# Patient Record
Sex: Female | Born: 2000 | Hispanic: Yes | Marital: Single | Smoking: Never smoker
Health system: Southern US, Community
[De-identification: ages and names within clinical notes are randomized; demographics above are authoritative.]

## PROBLEM LIST (undated history)

## (undated) DIAGNOSIS — R569 Unspecified convulsions: Secondary | ICD-10-CM

## (undated) DIAGNOSIS — R51 Headache: Secondary | ICD-10-CM

## (undated) DIAGNOSIS — F419 Anxiety disorder, unspecified: Secondary | ICD-10-CM

## (undated) DIAGNOSIS — G259 Extrapyramidal and movement disorder, unspecified: Secondary | ICD-10-CM

## (undated) HISTORY — PX: NASAL SEPTUM SURGERY: SHX37

## (undated) HISTORY — DX: Extrapyramidal and movement disorder, unspecified: G25.9

## (undated) HISTORY — DX: Headache: R51

---
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
## 2021-01-07 ENCOUNTER — Encounter: Payer: Self-pay | Admitting: Family

## 2021-01-07 ENCOUNTER — Other Ambulatory Visit: Payer: Self-pay

## 2021-01-07 ENCOUNTER — Ambulatory Visit (INDEPENDENT_AMBULATORY_CARE_PROVIDER_SITE_OTHER): Payer: Medicaid Other | Admitting: Family

## 2021-01-07 VITALS — BP 106/64 | HR 72 | Resp 16 | Ht 62.5 in | Wt 141.0 lb

## 2021-01-07 DIAGNOSIS — F3341 Major depressive disorder, recurrent, in partial remission: Secondary | ICD-10-CM | POA: Diagnosis not present

## 2021-01-07 DIAGNOSIS — R011 Cardiac murmur, unspecified: Secondary | ICD-10-CM

## 2021-01-07 DIAGNOSIS — Z7189 Other specified counseling: Secondary | ICD-10-CM

## 2021-01-07 DIAGNOSIS — F445 Conversion disorder with seizures or convulsions: Secondary | ICD-10-CM | POA: Diagnosis not present

## 2021-01-07 DIAGNOSIS — F419 Anxiety disorder, unspecified: Secondary | ICD-10-CM | POA: Insufficient documentation

## 2021-01-07 DIAGNOSIS — F411 Generalized anxiety disorder: Secondary | ICD-10-CM | POA: Diagnosis not present

## 2021-01-07 DIAGNOSIS — F3281 Premenstrual dysphoric disorder: Secondary | ICD-10-CM

## 2021-01-07 DIAGNOSIS — Z79899 Other long term (current) drug therapy: Secondary | ICD-10-CM

## 2021-01-07 DIAGNOSIS — Z719 Counseling, unspecified: Secondary | ICD-10-CM

## 2021-01-07 DIAGNOSIS — F900 Attention-deficit hyperactivity disorder, predominantly inattentive type: Secondary | ICD-10-CM

## 2021-01-07 DIAGNOSIS — N926 Irregular menstruation, unspecified: Secondary | ICD-10-CM

## 2021-01-07 DIAGNOSIS — F339 Major depressive disorder, recurrent, unspecified: Secondary | ICD-10-CM | POA: Insufficient documentation

## 2021-01-07 MED ORDER — AMPHETAMINE SULFATE 10 MG PO TABS: 10.0000 mg | ORAL_TABLET | Freq: Every day | ORAL | 0 refills | Status: DC

## 2021-01-07 NOTE — Progress Notes (Signed)
[facility] [address] [place] [address] Dept: [phone] Dept Fax: [phone] Loc: [phone] Loc Fax: [phone]  Medication Check  Patient ID: Bridget Eaton, female  DOB: [birth year], 20 y.o.  MRN: [id number]  Date of Evaluation: 01/07/2021 PCP: Milus Height, PA  Accompanied by: self Patient Lives with: brothers  HISTORY/CURRENT STATUS: HPI  Patient here by herself for the visit today. Patient interactive and appropriate with provider. Patient graduated high school in December and working full-time now. Applied to 2 colleges for the fall in [place] with  2 majors for studying. Patient doing well with work and life balance now, but having issues with limited emotional responses. Still having issues with short-term memory and concerned with starting college in the fall. Had tried Concerta with adverse effects and discontinued medication. Wanting to discuss other options today.   EDUCATION/WORK: School: Graduated high school in December Accepted into [employer] for college in the Fall Scholarship for [employer].  Work: [employer] Monday through Friday about 34 hours/week  Activities/ Exercise: intermittently  MEDICAL HISTORY: Appetite: Less without mother to cook or grocery MVI/Other: B12 and Iron.  Sleep: Bedtime: 11-12:00 am  Awakens: 6:30-7:00 am  Concerns: Initiation/Maintenance/Other: No reported issues.   Individual Medical History/ Review of Systems: Changes? :Yes, seen Dr. [name] recently before retirement. Sent in new Rx for her Lexapro 20 mg. GYN place Nexaplanon.   Allergies: Fish-derived products and Shellfish allergy  Current Medications:  Current Outpatient Medications:  .  Amphetamine Sulfate (EVEKEO) 10 MG TABS, Take 10 mg by mouth daily., Disp: 30 tablet, Rfl: 0 .  escitalopram  (LEXAPRO) 20 MG tablet, Take 1 tablet (20 mg total) by mouth daily after breakfast., Disp: 90 tablet, Rfl: 3 .  etonogestrel (NEXPLANON) 68 MG IMPL implant, See admin instructions., Disp: , Rfl:  .  Ferrous Sulfate (IRON) 28 MG TABS, 1 tablet, Disp: , Rfl:  Medication Side Effects: None  Family Medical/ Social History: Changes? None reported recently. Went to visit mother and family in [place]  MENTAL HEALTH: Mental Health Issues: Depression and Anxiety-Lexapro 20 mg daily with no concerns and controlling symptoms.   PHYSICAL EXAM; Vitals:  Vitals:   01/07/21 1444  BP: 106/64  Pulse: 72  Resp: 16  Height: 5' 2.5" (1.588 m)  Weight: 141 lb (64 kg)  BMI (Calculated): 25.36   Physical Exam Vitals reviewed.  Constitutional:      Appearance: Normal appearance. She is well-developed, normal weight and well-nourished.  HENT:     Head: Normocephalic and atraumatic.     Right Ear: Tympanic membrane, ear canal and external ear normal.     Left Ear: Tympanic membrane, ear canal and external ear normal.     Nose: Nose normal.     Mouth/Throat:     Mouth: Oropharynx is clear and moist. Mucous membranes are moist.  Eyes:     Extraocular Movements: Extraocular movements intact and EOM normal.     Conjunctiva/sclera: Conjunctivae normal.     Pupils: Pupils are equal, round, and reactive to light.  Cardiovascular:     Rate and Rhythm: Normal rate and regular rhythm.     Pulses: Normal pulses and intact distal pulses.     Heart sounds: Murmur heard.    Pulmonary:     Effort: Pulmonary effort is normal.     Breath sounds: Normal breath sounds.  Abdominal:  General: Bowel sounds are normal.     Palpations: Abdomen is soft.  Musculoskeletal:        General: Normal range of motion.     Cervical back: Normal range of motion and neck supple.  Skin:    General: Skin is warm and dry.     Capillary Refill: Capillary refill takes less than 2 seconds.  Neurological:     General: No  focal deficit present.     Mental Status: She is alert and oriented to person, place, and time.     Deep Tendon Reflexes: Reflexes are normal and symmetric.  Psychiatric:        Mood and Affect: Mood and affect normal.        Behavior: Behavior normal.        Thought Content: Thought content normal.        Judgment: Judgment normal.   General Physical Exam: Unchanged from previous exam, date:None Changed:None reported  DIAGNOSES:    ICD-10-CM   1. ADHD (attention deficit hyperactivity disorder), inattentive type  F90.0   2. Recurrent major depression in partial remission (HCC)  F33.41   3. 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 GAD (generalized anxiety disorder)  F41.1   5. Premenstrual dysphoric disorder  F32.81   6. Heart murmur  R01.1   7. Menstrual disorder  N92.6   8. Patient counseled  Z71.9   9. Medication management  Z79.899   10. Goals of care, counseling/discussion  Z71.89    ASSESSMENT: Patient tolerating her Lexapro with no concerns and no side effects. Seen Dr. [name] 1 last time before retirement. No changes made to current dose of Lexapro to assist with management of her anxiety and depression. See other medical professional to address other ongoing medical issues with management of these diagnoses. Reports limited emotional responses to being happy or sad, but not wanting to change medication at this time. Having continued issues with short-term memory with trial and failure of Concerta. Looking at treatment options for her attention at the visit today.   RECOMMENDATIONS:  Reviewed old records and/or current chart with the patient since last f/u visit.  Support provided to patient with mother moving to [place] with her family and patient being separated from her mother.   No significant medical history with changes reported and reviewed  today's examination with patient.  Information regarding high school graduation and applications for  college in the fall.  Advised patient to seek disability services, as needed, for college to assist with accommodations and/or modification for academic success.   Reviewed work history with full-time hours and managing her stress level.  May consider counseling to assist with anxiety, depression and attention issues. Services to be discussed at next visit.   Reviewed structure and routine for consistency with management of daily activities.   Discussed visit with Dr. [name] before retirement and no changes  made to medications.   Counseled medication pharmacokinetics, options, dosage, administration, desired effects, and possible side effects.   Continue with Lexapro 20 mg daily, no Rx today Start Eveleo 10 mg 1/2-1 tablet daily, # 30 with no RF's.RX for above e-scribed and sent to pharmacy on record  [id number] [place], [place] - [address] [place] [address] Phone: [phone] Fax: [phone]  Advised importance of:  Good sleep hygiene (8- 10 hours per night, no TV or video games for 1 hour before bedtime) Limited screen time (none on  school nights, no more than 2 hours/day on weekends, use of screen time for motivation) Regular exercise(outside and active play) Healthy eating (drink water or milk, no sodas/sweet tea, limit portions and no seconds).   NEXT APPOINTMENT: Return in about 3 months (around 04/06/2021) for f/u visit .  [name], NP Counseling Time: 40 mins Total Contact Time: 45 mins

## 2021-01-08 ENCOUNTER — Encounter: Payer: Self-pay | Admitting: Family

## 2021-01-08 ENCOUNTER — Telehealth: Payer: Self-pay

## 2021-01-08 NOTE — Telephone Encounter (Signed)
Outcome Approvedtoday PA Case: [id number], Status: Approved, Coverage Starts on: 01/08/2021 12:00:00 AM, Coverage Ends on: 01/08/2022 12:00:00 AM.

## 2021-04-05 ENCOUNTER — Encounter: Payer: Self-pay | Admitting: Family

## 2021-04-05 ENCOUNTER — Telehealth (INDEPENDENT_AMBULATORY_CARE_PROVIDER_SITE_OTHER): Payer: Medicaid Other | Admitting: Family

## 2021-04-05 ENCOUNTER — Other Ambulatory Visit: Payer: Self-pay

## 2021-04-05 DIAGNOSIS — Z7189 Other specified counseling: Secondary | ICD-10-CM

## 2021-04-05 DIAGNOSIS — F411 Generalized anxiety disorder: Secondary | ICD-10-CM | POA: Diagnosis not present

## 2021-04-05 DIAGNOSIS — F900 Attention-deficit hyperactivity disorder, predominantly inattentive type: Secondary | ICD-10-CM

## 2021-04-05 DIAGNOSIS — F3341 Major depressive disorder, recurrent, in partial remission: Secondary | ICD-10-CM | POA: Diagnosis not present

## 2021-04-05 DIAGNOSIS — Z719 Counseling, unspecified: Secondary | ICD-10-CM

## 2021-04-05 DIAGNOSIS — Z79899 Other long term (current) drug therapy: Secondary | ICD-10-CM

## 2021-04-05 MED ORDER — AMPHETAMINE SULFATE 10 MG PO TABS: 10.0000 mg | ORAL_TABLET | Freq: Every day | ORAL | 0 refills | Status: DC

## 2021-04-05 NOTE — Progress Notes (Signed)
[facility] [address] [place] [address] Dept: [phone] Dept Fax: [phone]  Medication Check visit via Virtual Video   Patient ID:  Bridget Eaton  female DOB: May 25, 2001   19 y.o.   MRN: [id number]   DATE:04/05/21  PCP: Milus Height, PA  Virtual Visit via Video Note  I connected with  Bridget Eaton on 04/05/21 at  2:00 PM EDT by a video enabled telemedicine application and verified that I am speaking with the correct person using two identifiers. Patient/Parent Location: at work   I discussed the limitations, risks, security and privacy concerns of performing an evaluation and management service by telephone and the availability of in person appointments. I also discussed with the parents that there may be a patient responsible charge related to this service. The parents expressed understanding and agreed to proceed.  Provider: [name], NP  Location: private work location  HPI/CURRENT STATUS: Bridget Eaton is here for medication management of the psychoactive medications for ADHD and review of educational and behavioral concerns.   Bridget Eaton currently taking Evekeo 10 mg 1/4 tablet in the morning and Lexapro 20 mg daily, which is working well. Takes medication as directed in the morning, Medication tends to last for several hours. Bridget Eaton is able to focus through homework.   Bridget Eaton is eating well (eating breakfast, lunch and dinner). No recent changes.   Sleeping well (goes to bed at 11-12:00 am the latest wakes at 7:00 am), sleeping through the night. Some afternoon's will take long naps, about 2 hours..  EDUCATION/WORK: School: [employer]- August  Year/Grade: college freshman  Sociology Major Work: Electrical engineer Exercise: intermittently-moving all day long at work.   Screen time: (phone, tablet, TV, computer): limited   MEDICAL  HISTORY: Individual Medical History/ Review of Systems: Recent visit to clinic for infection.   Family Medical/ Social History: Changes? No Patient Lives with: brother's in apartment in [place]  MENTAL HEALTH: Mental Health Issues:   Depression and Anxiety-controlled with Lexapro at 20 mg with no side effects, no suicidal thoughts or ideations. Reports emotional "numbness" but not wanting to change her dose or medication at this time.   Allergies: Allergies  Allergen Reactions  . Fish-Derived Products Hives  . Shellfish Allergy     Current Medications:  Current Outpatient Medications  Medication Instructions  . Amphetamine Sulfate (EVEKEO) 10 mg, Oral, Daily  . escitalopram (LEXAPRO) 20 mg, Oral, Daily after breakfast  . etonogestrel (NEXPLANON) 68 MG IMPL implant See admin instructions  . Ferrous Sulfate (IRON) 28 MG TABS 1 tablet   Medication Side Effects: None  DIAGNOSES:    ICD-10-CM   1. ADHD (attention deficit hyperactivity disorder), inattentive type  F90.0   2. GAD (generalized anxiety disorder)  F41.1   3. Recurrent major depression in partial remission (HCC)  F33.41   4. Medication management  Z79.899   5. Patient counseled  Z71.9   6. Goals of care, counseling/discussion  Z71.89    ASSESSMENT: Patient has continued to work full time putting in over 40 hours of work each week at the [employer]. She is enrolled in [employer] for the fall semester and waiting to receive her schedule. Not currently enrolled in Disability Services at school, but it is available if needed. She will continue to live at home with her 2 brother's to save money and expenses. NO changes with healthcare, but has been to clinic related to some care and  Abx for treatment. NO issues with sleeping and taking naps each day after work for about 2 hours. Eating has not changed and no other routine activity outside of work. Has continued with Evekeo 10 mg only taking 1/2 tablet with some efficacy and  Lexpro 20 mg daily with controlling her anxiety and depression. Still having some ups and downs, but this is on occasion. Patient not wanting to change any medication or doses at this time. Will continue to monitor on a regular basis.   PLAN/RECOMMENDATIONS:  Bridget Eaton has provided updates with health care needs and recent clinic visits needed for infections and Rx for Abx  Also has regular health care scheduled appointment for routine f/u's. No other medical changes reported today.   Patient enrolled in [place] for the fall semester and looking at Sociology as a major. Not sure what she wanting to do for a career but will continued to research her options with the degree. Not getting services for OARS, but can apply if needed for academic support.   Discussed family support and continuing to live with 2 older brothers in [place]. Mother recently visiting from [place] and will stay for the next few weeks. Needing emotional support with school and work environment with stressors.   No changes with dietary intake each day. Eating during the day when she gets a chance or has a break. Some days are busier than others. Supported eating protein and calories during the day in small meals. Including water during the day to avoid dehydration with warmer weather in the next few months.  Discussed continued activity at work and exhaustion. May need to reduce naps after work for better sleep hygiene. Patient has continued to sleep through the night with no issues, but sleep initiation comes later at night due to napping 2 hours after work. May consider adjusting times with napping only 30-60 mins each day after work.  Bridget Eaton has a routine and daily schedule with work. May need better structure and routine with schools starting along with balancing work. This will assist with organization and decreasing stressors.   May consider revisiting counseling for emotional dysregulation and ADHD coping skills. This will assist with  anxiety, depression and attention issues. May offer again on the next visit.   Counseled medication pharmacokinetics, options, dosage, administration, desired effects, and possible side effects.   Lexapro 20 mg daily, no Rx today Evekeo 10 mg 1/2-1 tablet daily, # 30 with no RF's.RX for above e-scribed and sent to pharmacy on record  [id number] [place], [place] - [address] [place] [address] Phone: [phone] Fax: [phone]  I discussed the assessment and treatment plan with the patient. The patient was provided an opportunity to ask questions and all were answered. The patient agreed with the plan and demonstrated an understanding of the instructions.   I provided 38 minutes of non-face-to-face time during this encounter. Completed record review for 10 minutes prior to the virtual video visit.   NEXT APPOINTMENT:  Visit date not found  Return in about 3 months (around 07/06/2021) for f/u visit.  The patient was advised to call back or seek an in-person evaluation if the symptoms worsen or if the condition fails to improve as anticipated.   [name], NP

## 2021-06-04 ENCOUNTER — Telehealth (INDEPENDENT_AMBULATORY_CARE_PROVIDER_SITE_OTHER): Payer: Medicaid Other | Admitting: Family

## 2021-06-04 ENCOUNTER — Encounter: Payer: Self-pay | Admitting: Family

## 2021-06-04 ENCOUNTER — Other Ambulatory Visit: Payer: Self-pay

## 2021-06-04 DIAGNOSIS — F3341 Major depressive disorder, recurrent, in partial remission: Secondary | ICD-10-CM

## 2021-06-04 DIAGNOSIS — F411 Generalized anxiety disorder: Secondary | ICD-10-CM

## 2021-06-04 DIAGNOSIS — Z719 Counseling, unspecified: Secondary | ICD-10-CM

## 2021-06-04 DIAGNOSIS — Z79899 Other long term (current) drug therapy: Secondary | ICD-10-CM

## 2021-06-04 DIAGNOSIS — F900 Attention-deficit hyperactivity disorder, predominantly inattentive type: Secondary | ICD-10-CM | POA: Diagnosis not present

## 2021-06-04 DIAGNOSIS — Z7189 Other specified counseling: Secondary | ICD-10-CM

## 2021-06-04 MED ORDER — LISDEXAMFETAMINE DIMESYLATE 10 MG PO CAPS: 10.0000 mg | ORAL_CAPSULE | Freq: Every day | ORAL | 0 refills | Status: DC

## 2021-06-04 NOTE — Progress Notes (Signed)
[facility] [address], [place] [place] [address] Dept: [phone] Dept Fax: [phone]  Medication Check visit via Virtual Video   Patient ID:  Bridget Eaton  female DOB: [birth year]   20 y.o.   MRN: [id number]   DATE:06/04/21  PCP: Milus Height, PA  Virtual Visit via Video Note  I connected with  Bridget Eaton on 06/04/21 at  9:00 AM EDT by a video enabled telemedicine application and verified that I am speaking with the correct person using two identifiers. Patient/Parent Location: at home   I discussed the limitations, risks, security and privacy concerns of performing an evaluation and management service by telephone and the availability of in person appointments. I also discussed with the parents that there may be a patient responsible charge related to this service. The parents expressed understanding and agreed to proceed.  Provider: [name], NP  Location: private work location  HPI/CURRENT STATUS: Bridget Eaton is here for medication management of the psychoactive medications for ADHD and review of educational and behavioral concerns.   Bridget Eaton currently taking Evekeo and Lexapro which is NOT working well for her ability to focus. Takes medication at 7:00 am. Medication tends to wear off around lunch time for the [facility] Bridget Eaton is unable to focus through work.   Bridget Eaton is eating well (eating breakfast, lunch and dinner). No recent reported changes.   Sleeping well (goes to bed at 11:00 pm wakes at 7:00 am), sleeping through the night.   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  Activities/ Exercise:  work requires increased activity.   Screen time: (phone, tablet, TV, computer): computer for school work, phone, TV and movies.  MEDICAL  HISTORY: Individual Medical History/ Review of Systems: Yes, having the inability to have a conversation.   Family Medical/ Social History: Changes? No Patient Lives with:  brothers, mother came to visit for graduation.   MENTAL HEALTH: Mental Health Issues:   Depression and Anxiety-Well controlled symptoms with Lexapro 20 mg daily, no suicidal thoughts or ideations.   Allergies: Allergies  Allergen Reactions   Fish-Derived Products Hives   Shellfish Allergy    Current Medications:  Current Outpatient Medications  Medication Instructions   escitalopram (LEXAPRO) 20 mg, Oral, Daily after breakfast   etonogestrel (NEXPLANON) 68 MG IMPL implant See admin instructions   Ferrous Sulfate (IRON) 28 MG TABS 1 tablet   lisdexamfetamine (VYVANSE) 10 mg, Oral, Daily   Medication Side Effects: Fatigue and Other: foggy, unable to focus  DIAGNOSES:    ICD-10-CM   1. ADHD (attention deficit hyperactivity disorder), inattentive type  F90.0     2. GAD (generalized anxiety disorder)  F41.1     3. Recurrent major depression in partial remission (HCC)  F33.41     4. Medication management  Z79.899     5. Patient counseled  Z71.9     6. Goals of care, counseling/discussion  Z71.89      ASSESSMENT: Bridget Eaton is a 20 year old female with a history of ADHD, Anxiety and Depression. Her anxiety and depression has been well maintained on the Lexapro 20 mg daily with no side effects. Had been on Evekeo with minimal efficacy for the majority of the day with ongoing complaints of brain fog, distractibility, unfocused, inability to stay on task, difficulties with conversations and not able to mental organize simple tasks. No other  reported changes that have occurred in the past few months since starting medication. NO health, sleep, or eating issues reported. Current job is mentally and physically demanding with increased stress daily. Working 5-6 day each week with putting in at least 45 hours each week. Not  currently getting any counseling for her anxiety or depression. Stopped the Evekeo on Wednesday with direction from provider with minimal changes. Options for treatment with review of pharmacogenetic results to be discussed today.   PLAN/RECOMMENDATIONS:  Patient discussed recent updates with work, school, home, health and medication from the last f/u visit.  Patient enrolled in [place] for the fall semester and will be attending full-time while living at home and continuing to work. Majoring in Sociology with several career options for her degree.   Bridget Eaton did not have formal services in place for her high school, but can apply for OARS at [facility] for support needed related to her anxiety and ADHD symptoms. Will reassess need for services at the next f/u visit.   Not currently getting counseling services for her anxiety or depression. Work hours are long and stressful with working 45 hours on average each week.   Discussed continued need for structure and daily routine with work, home and school settings. This will assist with motivation and academic success for the fall semester.   History of counseling and recommendations for restarting individual counseling for emotional dysregulation and ADHD coping skills.  Discussed medication history with stimulant trials. Reviewed pharmacogenetic testing results and provided options for medication to manage symptoms.   Counseled medication pharmacokinetics, options, dosage, administration, desired effects, and possible side effects.   Lexapro 20 mg daily, no Rx today Discontinued Evekeo Start Vyvanse 10 mg daily, # 30 with no RF's.RX for above e-scribed and sent to pharmacy on record  [id number] [place], [place] - [address] [place] [address] Phone: [phone] Fax: [phone]  I discussed the assessment and treatment plan with the patient The patient was provided an opportunity to ask questions and all  were answered. The patient agreed with the plan and demonstrated an understanding of the instructions.   I provided 28 minutes of non-face-to-face time during this encounter. Completed record review for 10 minutes prior to the virtual video visit.   NEXT APPOINTMENT:  07/05/2021  Return in about 3 months (around 09/04/2021) for f/u visit.  The patient was advised to call back or seek an in-person evaluation if the symptoms worsen or if the condition fails to improve as anticipated.   [name], NP

## 2021-07-05 ENCOUNTER — Encounter: Payer: Self-pay | Admitting: Family

## 2021-07-05 ENCOUNTER — Telehealth (INDEPENDENT_AMBULATORY_CARE_PROVIDER_SITE_OTHER): Payer: Medicaid Other | Admitting: Family

## 2021-07-05 ENCOUNTER — Other Ambulatory Visit: Payer: Self-pay

## 2021-07-05 DIAGNOSIS — R011 Cardiac murmur, unspecified: Secondary | ICD-10-CM

## 2021-07-05 DIAGNOSIS — Z719 Counseling, unspecified: Secondary | ICD-10-CM

## 2021-07-05 DIAGNOSIS — Z8489 Family history of other specified conditions: Secondary | ICD-10-CM

## 2021-07-05 DIAGNOSIS — Z79899 Other long term (current) drug therapy: Secondary | ICD-10-CM

## 2021-07-05 DIAGNOSIS — F3341 Major depressive disorder, recurrent, in partial remission: Secondary | ICD-10-CM | POA: Diagnosis not present

## 2021-07-05 DIAGNOSIS — Z7189 Other specified counseling: Secondary | ICD-10-CM

## 2021-07-05 DIAGNOSIS — F900 Attention-deficit hyperactivity disorder, predominantly inattentive type: Secondary | ICD-10-CM | POA: Diagnosis not present

## 2021-07-05 DIAGNOSIS — F411 Generalized anxiety disorder: Secondary | ICD-10-CM | POA: Diagnosis not present

## 2021-07-05 MED ORDER — LISDEXAMFETAMINE DIMESYLATE 10 MG PO CAPS: 10.0000 mg | ORAL_CAPSULE | Freq: Every day | ORAL | 0 refills | Status: DC

## 2021-07-05 NOTE — Progress Notes (Signed)
[facility] [address] [place] [place] [address] Dept: [phone] Dept Fax: [phone]  Medication Check visit via Virtual Video   Patient ID:  Bridget Eaton  female DOB: [birth year]   19 y.o.   MRN: [id number]   DATE:07/05/21  PCP: Milus Height, PA  Virtual Visit via Video Note  I connected with  Bridget Eaton on 07/05/21 at  2:00 PM EDT by a video enabled telemedicine application and verified that I am speaking with the correct person using two identifiers. Patient/Parent Location: parked in her car in the parking lot   I discussed the limitations, risks, security and privacy concerns of performing an evaluation and management service by telephone and the availability of in person appointments. I also discussed with the parents that there may be a patient responsible charge related to this service. The parents expressed understanding and agreed to proceed.  Provider: [name], NP  Location: private work locaiton  HPI/CURRENT STATUS: Bridget Eaton is here for medication management of the psychoactive medications for ADHD and review of educational and behavioral concerns.   Bridget Eaton currently taking Lexapro and Vyvanse, which is working well. Takes medication daily as directed. Medication tends to wear off around evening time. Bridget Eaton is able to focus through school/homework.   Bridget Eaton is eating well (eating breakfast, lunch and dinner). Eating well with no issues reported.   Sleeping well (getting enough sleep each night), sleeping through the night. Adjusted sleep schedule with 7 hours/night and napping for the day.   EDUCATION: School: [employer]-15 credit hours this semester Year/Grade:  college freshman   Sociology Major Working at the [employer] To cut hours back to 4 days/week  Activities/ Exercise: intermittently-some recently with friends  Screen  time: (phone, tablet, TV, computer): computer for school, phone and TV along with movies on occasion.   MEDICAL HISTORY: Individual Medical History/ Review of Systems: Yes, Migraine with need for injection for treatment.   Family Medical/ Social History: Changes? None Patient Lives with: brothers  MENTAL HEALTH: Mental Health Issues:   Depression and Anxiety-symptom control with Lexapro 20 mg daily with no side effects or adverse effects.   Allergies: Allergies  Allergen Reactions   Fish-Derived Products Hives   Shellfish Allergy    Current Medications:  Current Outpatient Medications  Medication Instructions   escitalopram (LEXAPRO) 20 mg, Oral, Daily after breakfast   etonogestrel (NEXPLANON) 68 MG IMPL implant See admin instructions   Ferrous Sulfate (IRON) 28 MG TABS 1 tablet   lisdexamfetamine (VYVANSE) 10 mg, Oral, Daily   Medication Side Effects: None  DIAGNOSES:    ICD-10-CM   1. ADHD (attention deficit hyperactivity disorder), inattentive type  F90.0     2. GAD (generalized anxiety disorder)  F41.1     3. Heart murmur  R01.1     4. Recurrent major depression in partial remission (HCC)  F33.41     5. Family history of headaches  Z84.89     6. Medication management  Z79.899     7. Patient counseled  Z71.9     8. Goals of care, counseling/discussion  Z71.89      ASSESSMENT: Bridget Eaton is a 20 year old female with a history of ADHD, Anxiety and Depression with efficacy with current Vyvanse and Lexapro daily. Starting school next week at [employer] with 15 credit hours this semester. 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 No current services in place at this time and no concerns. Some social outings when she has time. No formal exercise and trying to eat plenty during the day. Had recent urgent care visit due to migraine, but no other health concerns. Will continue with mediation and dose with reassessment  in 3 months.  PLAN/RECOMMENDATIONS:  Patient provided updates with school for this semester, credit hours, days of the week for classes, time and schedule with work.  Working 4 days/week with educational classes 5 days/week. Discussed 15 credit hours with required hours outside of the classroom required.   Not currently getting services at school, but can apply for OARS services if needed. May need help with attention and anxiety is had formal services in place. Support given for applying to get learning support.   Daily routine and structure along with organization will assist with academic success. Motivation and positive reinforcement will assist with learning and personal success.   Recommended individual counseling for emotional dysregulation and ADHD coping skills. Not currently getting services, but may consider with current situation and limited support at home.   Discussed sleep hygiene and bedtime routine. Encouraged limiting the use of screens in the evening time and turning off all electronics an hour before bedtime.   Counseled medication pharmacokinetics, options, dosage, administration, desired effects, and possible side effects.   Vyvanse 10 mg daily, # 30 with no RF's May need to adjust depending on efficacy each day Lexapro 20 mg daily, no Rx today RX for above e-scribed and sent to pharmacy on record  [id number] [place], [place] - [address] [place] [address] Phone: [phone] Fax: [phone]  I discussed the assessment and treatment plan with the patient. The patient was provided an opportunity to ask questions and all were answered. The patient agreed with the plan and demonstrated an understanding of the instructions.   I provided 28 minutes of non-face-to-face time during this encounter.  Completed record review for 10 minutes prior to the virtual video visit.   NEXT APPOINTMENT:  Visit date not found  Return in  about 3 months (around 10/05/2021) for f/u visit.  The patient was advised to call back or seek an in-person evaluation if the symptoms worsen or if the condition fails to improve as anticipated.   [name], NP

## 2021-08-06 ENCOUNTER — Other Ambulatory Visit: Payer: Self-pay

## 2021-08-06 DIAGNOSIS — F3281 Premenstrual dysphoric disorder: Secondary | ICD-10-CM

## 2021-08-06 DIAGNOSIS — F411 Generalized anxiety disorder: Secondary | ICD-10-CM

## 2021-08-06 DIAGNOSIS — F3341 Major depressive disorder, recurrent, in partial remission: Secondary | ICD-10-CM

## 2021-08-06 MED ORDER — LISDEXAMFETAMINE DIMESYLATE 10 MG PO CAPS: 10.0000 mg | ORAL_CAPSULE | Freq: Every day | ORAL | 0 refills | Status: DC

## 2021-08-06 MED ORDER — ESCITALOPRAM OXALATE 20 MG PO TABS: 20.0000 mg | ORAL_TABLET | Freq: Every day | ORAL | 3 refills | Status: DC

## 2021-08-06 NOTE — Telephone Encounter (Signed)
E-Prescribed Lexapro 20 and Vyvanse 10 directly to  [id number] [place], [place] - [address] [place] [address] Phone: [phone] Fax: [phone]

## 2022-11-24 ENCOUNTER — Other Ambulatory Visit (HOSPITAL_COMMUNITY): Payer: Self-pay

## 2022-11-24 ENCOUNTER — Other Ambulatory Visit: Payer: Self-pay

## 2022-11-24 DIAGNOSIS — F3281 Premenstrual dysphoric disorder: Secondary | ICD-10-CM

## 2022-11-24 DIAGNOSIS — F3341 Major depressive disorder, recurrent, in partial remission: Secondary | ICD-10-CM

## 2022-11-24 DIAGNOSIS — F411 Generalized anxiety disorder: Secondary | ICD-10-CM

## 2022-11-24 MED ORDER — ESCITALOPRAM OXALATE 20 MG PO TABS
20.0000 mg | ORAL_TABLET | Freq: Every day | ORAL | 3 refills | Status: AC
  Filled 2022-11-24: qty 90, 90d supply, fill #0

## 2022-11-24 MED ORDER — LISDEXAMFETAMINE DIMESYLATE 10 MG PO CAPS
10.0000 mg | ORAL_CAPSULE | Freq: Every day | ORAL | 0 refills | Status: DC
  Filled 2022-11-24: qty 3, 3d supply, fill #0
  Filled 2022-11-29: qty 27, 27d supply, fill #1

## 2022-11-24 NOTE — Telephone Encounter (Signed)
Lexapro 20 mg daily, #30 with 3 RF's and Vyvanse 10 mg daily, #30 with no RF's.RX for above e-scribed and sent to pharmacy on record  [place] [facility] [address] [place] [address] Phone: [phone] Fax: [phone]

## 2022-11-25 ENCOUNTER — Other Ambulatory Visit (HOSPITAL_COMMUNITY): Payer: Self-pay

## 2022-11-27 ENCOUNTER — Other Ambulatory Visit (HOSPITAL_COMMUNITY): Payer: Self-pay

## 2022-11-29 ENCOUNTER — Other Ambulatory Visit (HOSPITAL_COMMUNITY): Payer: Self-pay

## 2022-12-01 ENCOUNTER — Other Ambulatory Visit (HOSPITAL_COMMUNITY): Payer: Self-pay

## 2022-12-01 ENCOUNTER — Encounter: Payer: Self-pay | Admitting: Family

## 2022-12-01 ENCOUNTER — Ambulatory Visit (INDEPENDENT_AMBULATORY_CARE_PROVIDER_SITE_OTHER): Payer: Medicaid Other | Admitting: Family

## 2022-12-01 VITALS — BP 112/68 | HR 76 | Resp 16 | Ht 62.0 in | Wt 163.6 lb

## 2022-12-01 DIAGNOSIS — Z79899 Other long term (current) drug therapy: Secondary | ICD-10-CM

## 2022-12-01 DIAGNOSIS — Z7189 Other specified counseling: Secondary | ICD-10-CM

## 2022-12-01 DIAGNOSIS — Z719 Counseling, unspecified: Secondary | ICD-10-CM

## 2022-12-01 DIAGNOSIS — F411 Generalized anxiety disorder: Secondary | ICD-10-CM | POA: Diagnosis not present

## 2022-12-01 DIAGNOSIS — F3341 Major depressive disorder, recurrent, in partial remission: Secondary | ICD-10-CM | POA: Diagnosis not present

## 2022-12-01 DIAGNOSIS — F900 Attention-deficit hyperactivity disorder, predominantly inattentive type: Secondary | ICD-10-CM

## 2022-12-01 NOTE — Progress Notes (Signed)
[facility] [address] [place] [address] Dept: [phone] Dept Fax: [phone] Loc: [phone] Loc Fax: [phone]  Medication Check  Patient ID: Bridget Eaton, female  DOB: [birth year], 22 y.o.  MRN: [id number]  Date of Evaluation: 12/01/2022 PCP: Bridget Odor, PA  Accompanied by:  self Patient Lives with: mother and brother  HISTORY/CURRENT STATUS: HPI Patient here by herself for the visit today. Patient interactive and appropriate with provider today. Patient with no significant changes in health, family and medication regimen. Has restarted her Lexapro 20 mg daily,  no medication for her ADHD.   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   MEDICAL HISTORY: Appetite: Normal and nothing significant that has changed  MVI/Other: Vitamin D supplements and Fe+  Sleep: Sleeping ok and sleeping less hours during the semester with school work and homework.  Concerns: Initiation/Maintenance/Other: None reported  Individual Medical History/ Review of Systems: Changes? :None reported recently.   Allergies: Fish-derived products and Shellfish allergy  Current Medications:  Current Outpatient Medications  Medication Instructions   escitalopram (LEXAPRO) 20 mg, Oral, Daily after breakfast   etonogestrel (NEXPLANON) 68 MG IMPL implant See admin instructions   Ferrous Sulfate (IRON) 28 MG TABS 1 tablet   Vitamin D, Ergocalciferol, (DRISDOL) 1.25 MG (50000 UNIT) CAPS capsule Oral   Medication Side Effects: None Family Medical/ Social History:  Changes? Yes mother recently moved back from [place].  MENTAL HEALTH: Mental Health Issues: Depression and Anxiety-on and off with no counseling. History of counseling at school last semester and was helpful   PHYSICAL EXAM; Vitals:  Today's Vitals   12/01/22 1016  BP: 112/68  Pulse: 76  Resp: 16  Weight: 163 lb 9.6 oz (74.2 kg)  Height: 5\' 2"  (1.575 m)   Body mass index is 29.92 kg/m.  General Physical Exam: Unchanged from previous exam, date:07/05/2021 Changed:None   DIAGNOSES:    ICD-10-CM   1. ADHD (attention deficit hyperactivity disorder), inattentive type  F90.0     2. GAD (generalized anxiety disorder)  F41.1     3. Recurrent major depression in partial remission ([place])  F33.41     4. Medication management  Z79.899     5. Patient counseled  Z71.9     6. Goals of care, counseling/discussion  Z71.89     ASSESSMENT: Bridget Eaton is a 22 year old female with a history of ADHD, Anxiety and Depression. Has recently restarted on Lexapro 20 mg daily for her Anxiety and depression, stopped Vyvanse for her ADHD along due to side effects. Has continued [employer] for sociology and working 4 days/week. No formal services in place at school. Taking 4 classes each semester and looking at options for a second job. No significant changes in healthcare reported. Eating has not changed and taking vitamins/supplements as recommended. Sleep schedule has not been a concerns. Self regulation with inattention. Seeing counseling during the school semester, but not currently now. Doesn't want to take any medication for her ADHD and will continue her Lexapro.   RECOMMENDATIONS:  Updates with school and current semester classes for the spring time.  No formal services in place and help is available as needed on campus.  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  Counseling on campus last semester and suggested other options.  List of local provided given to patient today.  Strategies discussed for her difficulties with attention due to lack of efficacy with her ADHD medication.   Sleep habits and sleep hygiene discussed with patient related to minimal hours of sleep each night.   Medication management discussed with patient and current symptoms with some efficacy for anxiety and depression.   Counseled medication pharmacokinetics, options, dosage, administration, desired effects, and possible side effects.  Lexapro 20 mg daily, no Rx today Discontinued Vyvanse due to side effects.  May consider liquid medication to assist with attention.   I discussed the assessment and treatment plan with the patient. The patient was provided an opportunity to ask questions and all were answered. The patient agreed with the plan and demonstrated an understanding of the instructions.   NEXT APPOINTMENT: Return in about 6 months (around 06/01/2023) for f/u visit .  The patient was advised to call back or seek an in-person evaluation if the symptoms worsen or if the condition fails to improve as anticipated.   Carolann Littler, NP  Counseling Time: 38 mins Total Contact Time: 43 mins

## 2022-12-02 ENCOUNTER — Encounter: Payer: Self-pay | Admitting: Family

## 2023-03-06 ENCOUNTER — Telehealth: Payer: Medicaid Other | Admitting: Family

## 2023-04-01 ENCOUNTER — Other Ambulatory Visit: Payer: Self-pay

## 2023-04-01 ENCOUNTER — Ambulatory Visit
Admission: EM | Admit: 2023-04-01 | Discharge: 2023-04-01 | Disposition: A | Discharge status: Home / Self Care | Payer: Medicaid Other | Attending: Family Medicine | Admitting: Family Medicine

## 2023-04-01 ENCOUNTER — Encounter: Payer: Self-pay | Admitting: *Deleted

## 2023-04-01 DIAGNOSIS — J020 Streptococcal pharyngitis: Secondary | ICD-10-CM

## 2023-04-01 LAB — POCT RAPID STREP A (OFFICE): Rapid Strep A Screen: POSITIVE — AB

## 2023-04-01 MED ORDER — PREDNISONE 20 MG PO TABS: ORAL_TABLET | ORAL | 0 refills | Status: DC

## 2023-04-01 MED ORDER — AMOXICILLIN-POT CLAVULANATE 875-125 MG PO TABS: 1.0000 | ORAL_TABLET | Freq: Two times a day (BID) | ORAL | 0 refills | Status: DC

## 2023-04-01 NOTE — Discharge Instructions (Addendum)
Instructed patient to take medications as directed with food to completion.  Advised patient to take prednisone with first dose of Augmentin for the next 5 of 7 days.  Encouraged increase daily water intake to 64 ounces per day while taking these medications.  Advised if symptoms worsen and/or unresolved please follow-up with PCP or here for further evaluation. 

## 2023-04-01 NOTE — ED Triage Notes (Signed)
Pt reports for past 2-3 days she has had a sore throat,ear pain,lymph node swelling.

## 2023-04-01 NOTE — ED Provider Notes (Signed)
[facility]    CSN: [id number] Arrival date & time: 04/01/23  1305      History   Chief Complaint Chief Complaint  Patient presents with   Sore Throat   Lymphadenopathy   Otalgia    HPI Bridget Eaton is a 22 y.o. female.   HPI 22 year old female presents with sore throat, lymphadenopathy and ear pain for 2-3 days.  PMH significant for obesity, seizures, and anxiety.  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 Authorizing Provider  amoxicillin-clavulanate (AUGMENTIN) 875-125 MG tablet Take 1 tablet by mouth every 12 (twelve) hours. 04/01/23  Yes Trevor Iha, FNP  predniSONE (DELTASONE) 20 MG tablet Take 3 tabs PO daily x 5 days. 04/01/23  Yes Trevor Iha, FNP  escitalopram (LEXAPRO) 20 MG tablet Take 1 tablet (20 mg total) by mouth daily after breakfast. 11/24/22   Paretta-Leahey, Miachel Roux, NP  etonogestrel (NEXPLANON) 68 MG IMPL implant See admin instructions.    [provider]  Ferrous Sulfate (IRON) 28 MG TABS 1 tablet    [provider]  Vitamin D, Ergocalciferol,  (DRISDOL) 1.25 MG (50000 UNIT) CAPS capsule Take by mouth. 10/14/22   [provider]  lisdexamfetamine (VYVANSE) 10 MG capsule Take 1 capsule (10 mg total) by mouth daily. 11/24/22 11/29/22  Paretta-Leahey, Miachel Roux, NP    Family History Family History  Problem Relation Age of Onset   Other Mother    Hypertension Father    Hyperlipidemia Father     Social History Social History   Tobacco Use   Smoking status: Never   Smokeless tobacco: Never  Vaping Use   Vaping Use: Never used  Substance Use Topics   Alcohol use: No   Drug use: No     Allergies   Fish-derived products and Shellfish allergy   Review of Systems Review of Systems  HENT:  Positive for sore throat.   All other systems reviewed and are negative.    Physical Exam Triage Vital Signs ED Triage Vitals  Enc Vitals Group     BP      Pulse      Resp      Temp      Temp src      SpO2  Weight      Height      Head Circumference      Peak Flow      Pain Score      Pain Loc      Pain Edu?      Excl. in GC?    No data found.  Updated Vital Signs BP 109/74   Pulse 94   Temp 99.3 F (37.4 C)   Resp 18   LMP 03/18/2023   SpO2 98%    Physical Exam Vitals and nursing note reviewed.  Constitutional:      Appearance: Normal appearance. She is well-developed. She is obese.  HENT:     Head: Normocephalic and atraumatic.     Right Ear: Tympanic membrane, ear canal and external ear normal.     Left Ear: Tympanic membrane, ear canal and external ear normal.     Nose: Nose normal.     Mouth/Throat:     Mouth: Mucous membranes are moist.     Pharynx: Uvula midline. Oropharyngeal exudate, posterior oropharyngeal erythema and uvula swelling present.     Tonsils: 4+ on the right. 4+ on the left.  Eyes:     Conjunctiva/sclera: Conjunctivae normal.     Pupils: Pupils are equal, round, and reactive to light.  Cardiovascular:     Rate and Rhythm: Normal rate and regular rhythm.     Heart  sounds: Normal heart sounds.  Pulmonary:     Effort: Pulmonary effort is normal.     Breath sounds: Normal breath sounds. No wheezing or rhonchi.  Musculoskeletal:        General: Normal range of motion.     Cervical back: Normal range of motion and neck supple. Tenderness present.  Lymphadenopathy:     Cervical: Cervical adenopathy present.  Skin:    General: Skin is warm and dry.  Neurological:     General: No focal deficit present.     Mental Status: She is alert and oriented to person, place, and time.  Psychiatric:        Mood and Affect: Mood normal.        Behavior: Behavior normal.      UC Treatments / Results  Labs (all labs ordered are listed, but only abnormal results are displayed) Labs Reviewed  POCT RAPID STREP A (OFFICE) - Abnormal; Notable for the following components:      Result Value   Rapid Strep A Screen Positive (*)    All other components within normal limits    EKG   Radiology No results found.  Procedures Procedures (including critical care time)  Medications Ordered in UC Medications - No data to display  Initial Impression / Assessment and Plan / UC Course  I have reviewed the triage vital signs and the nursing notes.  Pertinent labs & imaging results that were available during my care of the patient were reviewed by me and considered in my medical decision making (see chart for details).     MDM: Strep pharyngitis-Rx'd Augmentin 875/125 mg twice daily x 7 days, prednisone 60 mg daily x 5 days. Instructed patient to take medications as directed with food to completion.  Advised patient to take prednisone with first dose of Augmentin for the next 5 of 7 days.  Encouraged increase daily water intake to 64 ounces per day while taking these medications.  Advised if symptoms worsen and/or unresolved please follow-up with PCP or here for further evaluation.  Work note provided to patient prior  to discharge today.  Patient discharged home,  hemodynamically stable. Final Clinical Impressions(s) / UC Diagnoses   Final diagnoses:  Strep pharyngitis     Discharge Instructions      Instructed patient to take medications as directed with food to completion.  Advised patient to take prednisone with first dose of Augmentin for the next 5 of 7 days.  Encouraged increase daily water intake to 64 ounces per day while taking these medications.  Advised if symptoms worsen and/or unresolved please follow-up with PCP or here for further evaluation.     ED Prescriptions     Medication Sig Dispense Auth. Provider   amoxicillin-clavulanate (AUGMENTIN) 875-125 MG tablet Take 1 tablet by mouth every 12 (twelve) hours. 14 tablet Trevor Iha, FNP   predniSONE (DELTASONE) 20 MG tablet Take 3 tabs PO daily x 5 days. 15 tablet Trevor Iha, FNP      PDMP not reviewed this encounter.   Trevor Iha, FNP 04/01/23 1408

## 2023-06-09 ENCOUNTER — Encounter: Payer: Self-pay | Admitting: Neurology

## 2023-07-03 ENCOUNTER — Encounter: Payer: Self-pay | Admitting: Neurology

## 2023-07-03 ENCOUNTER — Ambulatory Visit: Payer: Medicaid Other | Admitting: Neurology

## 2023-07-03 VITALS — BP 116/75 | HR 85 | Ht 62.0 in | Wt 164.0 lb

## 2023-07-03 DIAGNOSIS — R531 Weakness: Secondary | ICD-10-CM | POA: Diagnosis not present

## 2023-07-03 NOTE — Patient Instructions (Signed)
If you choose to have nerve and muscle testing and/or MRI brain, please contact my office.

## 2023-07-03 NOTE — Progress Notes (Unsigned)
[facility] Clinic Note - Initial Visit   Date: 07/03/2023   Bridget Eaton MRN: [id number] DOB: 17-Jun-2001   Dear Milus Height, PA:  Thank you for your kind referral of Bridget Eaton for consultation of generalized weakness. Although her history is well known to you, please allow [place] to reiterate it for the purpose of our medical record. The patient was accompanied to the clinic by self.    Bridget Eaton is a 22 y.o. right-handed female with depression, anxiety, history of conversion disorder presenting for evaluation of generalized weakness.   IMPRESSION/PLAN: Spells of generalized weakness and mild cognitive difficulty.  Neurological exam is entirely normal and non-focal.  I offered NCS/EMG and MRI brain to further investigate her symptoms, but my overall suspicion for primary neurological condition is very low.  It is possible that mood disorder could be contributing and urged her to follow-up with psychiatry.  She opted to hold on testing for now and will talk to her psychiatrist.   ------------------------------------------------------------- History of present illness: Starting in the 3rd grade, she began having problems with generalized weakness, shaking, unable to focus.  Symptoms are intermittent and occur about 2-3 times per week, lasting 3-4 hours.  She cannot identify triggers.  Symptoms improve with rest.  She denies numbness/tingling.  She stopped her antidepressant medications and will be seeing psychiatrist.    She works as an Firefighter.  She does smoke.  Drink alcohol occasionally.  She lives with her cousin.   She has previously been evaluated by Dr. Sharene Skeans for spells, seizure-like behavior, headaches, muscle weakness, and jerking which was diagnosed as non-epileptic spells and conversion disorder (2014).    Out-side paper records, electronic medical record, and images have been reviewed where available and summarized  as:  CT head wo contrast 04/04/2023: 1. Normal noncontrast CT appearance of the brain.  2.  Paranasal sinus inflammatory changes greater on the left.    No results found for: "HGBA1C" No results found for: "VITAMINB12" Lab Results  Component Value Date   TSH 0.901 10/30/2015   No results found for: "ESRSEDRATE", "POCTSEDRATE"  Past Medical History:  Diagnosis Date   Anxiety    Asthma    Headache(784.0)    Movement disorder    Seizures (HCC)     Past Surgical History:  Procedure Laterality Date   NASAL SEPTUM SURGERY       Medications:  Outpatient Encounter Medications as of 07/03/2023  Medication Sig Note   escitalopram (LEXAPRO) 20 MG tablet Take 1 tablet (20 mg total) by mouth daily after breakfast.    etonogestrel (NEXPLANON) 68 MG IMPL implant See admin instructions.    [DISCONTINUED] amoxicillin-clavulanate (AUGMENTIN) 875-125 MG tablet Take 1 tablet by mouth every 12 (twelve) hours.    [DISCONTINUED] Ferrous Sulfate (IRON) 28 MG TABS 1 tablet    [DISCONTINUED] predniSONE (DELTASONE) 20 MG tablet Take 3 tabs PO daily x 5 days.    [DISCONTINUED] Vitamin D, Ergocalciferol, (DRISDOL) 1.25 MG (50000 UNIT) CAPS capsule Take by mouth.    [DISCONTINUED] lisdexamfetamine (VYVANSE) 10 MG capsule Take 1 capsule (10 mg total) by mouth daily. 11/29/2022: Patient no longer takes medication 1.2.24 CMA   No facility-administered encounter medications on file as of 07/03/2023.    Allergies:  Allergies  Allergen Reactions   Fish-Derived Products Hives   Shellfish Allergy     Family History: Family History  Problem Relation Age of Onset   Other Mother    Hypertension Father    Hyperlipidemia Father  Migraines Cousin     Social History: Social History   Tobacco Use   Smoking status: Never   Smokeless tobacco: Never  Vaping Use   Vaping status: Former  Substance Use Topics   Alcohol use: No    Comment: Occ.   Drug use: No   Social History   Social History  Narrative   Are you right handed or left handed? Right    Are you currently employed ? 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 Y   Who lives with you?    What type of home do you live in: 1 story or 2 story? 2        Vital Signs:  BP 116/75   Pulse 85   Ht 5\' 2"  (1.575 m)   Wt 164 lb (74.4 kg)   SpO2 100%   BMI 30.00 kg/m     Neurological Exam: MENTAL STATUS including orientation to time, place, person, recent and remote memory, attention span and concentration, language, and fund of knowledge is normal.  Speech is not dysarthric.  CRANIAL NERVES: II:  No visual field defects.     III-IV-VI: Pupils equal round and reactive to light.  Normal conjugate, extra-ocular eye movements in all directions of gaze.  No nystagmus.  No ptosis.   V:  Normal facial sensation.    VII:  Normal facial symmetry and movements.   VIII:  Normal hearing and vestibular function.   IX-X:  Normal palatal movement.   XI:  Normal shoulder shrug and head rotation.   XII:  Normal tongue strength and range of motion, no deviation or fasciculation.  MOTOR:  No atrophy, fasciculations or abnormal movements.  No pronator drift.   Upper Extremity:  Right  Left  Deltoid  5/5   5/5   Biceps  5/5   5/5   Triceps  5/5   5/5   Wrist extensors  5/5   5/5   Wrist flexors  5/5   5/5   Finger extensors  5/5   5/5   Finger flexors  5/5   5/5   Dorsal interossei  5/5   5/5   Abductor pollicis  5/5   5/5   Tone (Ashworth scale)  0  0   Lower Extremity:  Right  Left  Hip flexors  5/5   5/5   Knee flexors  5/5   5/5   Knee extensors  5/5   5/5   Dorsiflexors  5/5   5/5   Plantarflexors  5/5   5/5   Toe extensors  5/5   5/5   Toe flexors  5/5   5/5   Tone (Ashworth scale)  0  0   MSRs:                                           Right        Left brachioradialis 2+  2+  biceps 2+  2+  triceps 2+  2+  patellar 2+  2+  ankle jerk 2+  2+  Hoffman no  no   plantar response down  down   SENSORY:  Normal and symmetric perception of light touch, pinprick, vibration, and temperature.  Romberg's sign absent.   COORDINATION/GAIT: Normal finger-to- nose-finger.  Intact rapid alternating movements bilaterally.  Able  to rise from a chair without using arms.  Gait narrow based and stable. Tandem and stressed gait intact.     Thank you for allowing me to participate in patient's care.  If I can answer any additional questions, I would be pleased to do so.    Sincerely,    Kimberlee Shoun K. Allena Katz, DO

## 2023-09-29 ENCOUNTER — Emergency Department (HOSPITAL_COMMUNITY): Payer: Medicaid Other

## 2023-09-29 ENCOUNTER — Emergency Department (HOSPITAL_COMMUNITY)
Admission: EM | Admit: 2023-09-29 | Discharge: 2023-09-29 | Disposition: A | Discharge status: Home / Self Care | Payer: Medicaid Other | Attending: Emergency Medicine | Admitting: Emergency Medicine

## 2023-09-29 ENCOUNTER — Other Ambulatory Visit: Payer: Self-pay

## 2023-09-29 ENCOUNTER — Encounter (HOSPITAL_COMMUNITY): Payer: Self-pay | Admitting: Emergency Medicine

## 2023-09-29 DIAGNOSIS — S0990XA Unspecified injury of head, initial encounter: Secondary | ICD-10-CM | POA: Diagnosis present

## 2023-09-29 DIAGNOSIS — S00511A Abrasion of lip, initial encounter: Secondary | ICD-10-CM | POA: Insufficient documentation

## 2023-09-29 DIAGNOSIS — S0081XA Abrasion of other part of head, initial encounter: Secondary | ICD-10-CM | POA: Insufficient documentation

## 2023-09-29 DIAGNOSIS — W19XXXA Unspecified fall, initial encounter: Secondary | ICD-10-CM | POA: Diagnosis not present

## 2023-09-29 DIAGNOSIS — Y92810 Car as the place of occurrence of the external cause: Secondary | ICD-10-CM | POA: Insufficient documentation

## 2023-09-29 DIAGNOSIS — S060X1A Concussion with loss of consciousness of 30 minutes or less, initial encounter: Secondary | ICD-10-CM | POA: Insufficient documentation

## 2023-09-29 LAB — PREGNANCY, URINE: Preg Test, Ur: NEGATIVE

## 2023-09-29 MED ORDER — ONDANSETRON 4 MG PO TBDP
4.0000 mg | ORAL_TABLET | Freq: Once | ORAL | Status: AC
  Administered 2023-09-29: 4 mg via ORAL
  Filled 2023-09-29: qty 1

## 2023-09-29 MED ORDER — ONDANSETRON HCL 4 MG/2ML IJ SOLN
4.0000 mg | Freq: Once | INTRAMUSCULAR | Status: AC
  Administered 2023-09-29: 4 mg via INTRAVENOUS
  Filled 2023-09-29: qty 2

## 2023-09-29 MED ORDER — ACETAMINOPHEN 500 MG PO TABS
1000.0000 mg | ORAL_TABLET | Freq: Once | ORAL | Status: AC
  Administered 2023-09-29: 1000 mg via ORAL
  Filled 2023-09-29: qty 2

## 2023-09-29 MED ORDER — ONDANSETRON 4 MG PO TBDP: 4.0000 mg | ORAL_TABLET | Freq: Three times a day (TID) | ORAL | 0 refills | Status: AC | PRN

## 2023-09-29 MED ORDER — KETOROLAC TROMETHAMINE 15 MG/ML IJ SOLN
15.0000 mg | Freq: Once | INTRAMUSCULAR | Status: AC
  Administered 2023-09-29: 15 mg via INTRAVENOUS
  Filled 2023-09-29: qty 1
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
## 2023-09-29 NOTE — ED Notes (Signed)
Pts facial wounds cleansed with saline.

## 2023-09-29 NOTE — ED Notes (Signed)
Ambulatory on a steady gait. Pt is currently eating popeye's chicken in room. Mother at bedside who will take pt home. DC instructions given to pt and mother. Alert and oriented x 4. Cleared for discharge.
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
